# Patient Record
Sex: Male | Born: 1980 | Race: White | Hispanic: No | Marital: Single | Smoking: Never smoker
Health system: Southern US, Community
[De-identification: ages and names within clinical notes are randomized; demographics above are authoritative.]

## PROBLEM LIST (undated history)

## (undated) DIAGNOSIS — F419 Anxiety disorder, unspecified: Secondary | ICD-10-CM

## (undated) DIAGNOSIS — F319 Bipolar disorder, unspecified: Secondary | ICD-10-CM

## (undated) HISTORY — DX: Anxiety disorder, unspecified: F41.9

## (undated) HISTORY — PX: APPENDECTOMY: SHX54

## (undated) HISTORY — DX: Bipolar disorder, unspecified: F31.9

## (undated) HISTORY — PX: GASTRIC BYPASS: SHX52

---

## 2011-10-28 DIAGNOSIS — F329 Major depressive disorder, single episode, unspecified: Secondary | ICD-10-CM | POA: Insufficient documentation

## 2011-10-28 DIAGNOSIS — F32A Depression, unspecified: Secondary | ICD-10-CM | POA: Insufficient documentation

## 2012-09-07 DIAGNOSIS — Z9884 Bariatric surgery status: Secondary | ICD-10-CM | POA: Insufficient documentation

## 2012-09-07 DIAGNOSIS — K909 Intestinal malabsorption, unspecified: Secondary | ICD-10-CM | POA: Insufficient documentation

## 2013-05-05 DIAGNOSIS — M222X9 Patellofemoral disorders, unspecified knee: Secondary | ICD-10-CM | POA: Insufficient documentation

## 2013-09-06 DIAGNOSIS — M25562 Pain in left knee: Secondary | ICD-10-CM

## 2013-09-06 DIAGNOSIS — M25561 Pain in right knee: Secondary | ICD-10-CM | POA: Insufficient documentation

## 2017-04-07 ENCOUNTER — Other Ambulatory Visit: Payer: Self-pay

## 2017-04-07 ENCOUNTER — Encounter: Payer: Self-pay | Admitting: Psychiatry

## 2017-04-07 ENCOUNTER — Ambulatory Visit (INDEPENDENT_AMBULATORY_CARE_PROVIDER_SITE_OTHER): Payer: Federal, State, Local not specified - PPO | Admitting: Psychiatry

## 2017-04-07 VITALS — BP 130/86 | HR 67

## 2017-04-07 DIAGNOSIS — Z8659 Personal history of other mental and behavioral disorders: Secondary | ICD-10-CM

## 2017-04-07 DIAGNOSIS — F411 Generalized anxiety disorder: Secondary | ICD-10-CM

## 2017-04-07 DIAGNOSIS — F330 Major depressive disorder, recurrent, mild: Secondary | ICD-10-CM | POA: Diagnosis not present

## 2017-04-07 MED ORDER — DULOXETINE HCL 30 MG PO CPEP: 30.0000 mg | ORAL_CAPSULE | Freq: Every day | ORAL | 1 refills | Status: DC

## 2017-04-07 MED ORDER — BUPROPION HCL ER (XL) 150 MG PO TB24: 150.0000 mg | ORAL_TABLET | Freq: Every day | ORAL | 1 refills | Status: DC

## 2017-04-07 MED ORDER — HYDROXYZINE PAMOATE 25 MG PO CAPS: 25.0000 mg | ORAL_CAPSULE | Freq: Every day | ORAL | 1 refills | Status: DC | PRN
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
## 2017-05-05 ENCOUNTER — Other Ambulatory Visit: Payer: Self-pay

## 2017-05-05 ENCOUNTER — Ambulatory Visit (INDEPENDENT_AMBULATORY_CARE_PROVIDER_SITE_OTHER): Payer: Federal, State, Local not specified - PPO | Admitting: Psychiatry

## 2017-05-05 ENCOUNTER — Encounter: Payer: Self-pay | Admitting: Psychiatry

## 2017-05-05 ENCOUNTER — Ambulatory Visit: Payer: Federal, State, Local not specified - PPO | Admitting: Psychiatry

## 2017-05-05 VITALS — BP 141/93 | HR 69 | Temp 99.1°F

## 2017-05-05 DIAGNOSIS — F330 Major depressive disorder, recurrent, mild: Secondary | ICD-10-CM

## 2017-05-05 DIAGNOSIS — Z8659 Personal history of other mental and behavioral disorders: Secondary | ICD-10-CM | POA: Diagnosis not present

## 2017-05-05 DIAGNOSIS — F411 Generalized anxiety disorder: Secondary | ICD-10-CM

## 2017-05-05 MED ORDER — DULOXETINE HCL 30 MG PO CPEP: 30.0000 mg | ORAL_CAPSULE | Freq: Every day | ORAL | 2 refills | Status: DC

## 2017-05-05 MED ORDER — BUPROPION HCL ER (XL) 150 MG PO TB24: 150.0000 mg | ORAL_TABLET | Freq: Every day | ORAL | 2 refills | Status: DC
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
## 2017-05-06 ENCOUNTER — Encounter: Payer: Self-pay | Admitting: Psychiatry

## 2017-05-28 ENCOUNTER — Encounter: Payer: Self-pay | Admitting: Emergency Medicine

## 2017-05-28 ENCOUNTER — Emergency Department: Payer: Federal, State, Local not specified - PPO

## 2017-05-28 ENCOUNTER — Emergency Department
Admission: EM | Admit: 2017-05-28 | Discharge: 2017-05-28 | Disposition: A | Discharge status: Home / Self Care | Payer: Federal, State, Local not specified - PPO | Attending: Student in an Organized Health Care Education/Training Program | Admitting: Student in an Organized Health Care Education/Training Program

## 2017-05-28 DIAGNOSIS — R109 Unspecified abdominal pain: Secondary | ICD-10-CM | POA: Diagnosis present

## 2017-05-28 DIAGNOSIS — Z79899 Other long term (current) drug therapy: Secondary | ICD-10-CM | POA: Diagnosis not present

## 2017-05-28 LAB — CBC WITH DIFFERENTIAL/PLATELET
Basophils Absolute: 0.1 10*3/uL (ref 0–0.1)
Basophils Relative: 1 %
EOS ABS: 0.2 10*3/uL (ref 0–0.7)
EOS PCT: 3 %
HCT: 49.3 % (ref 40.0–52.0)
Hemoglobin: 16.8 g/dL (ref 13.0–18.0)
LYMPHS ABS: 2 10*3/uL (ref 1.0–3.6)
Lymphocytes Relative: 29 %
MCH: 30.3 pg (ref 26.0–34.0)
MCHC: 34 g/dL (ref 32.0–36.0)
MCV: 88.9 fL (ref 80.0–100.0)
Monocytes Absolute: 0.7 10*3/uL (ref 0.2–1.0)
Monocytes Relative: 11 %
Neutro Abs: 3.8 10*3/uL (ref 1.4–6.5)
Neutrophils Relative %: 56 %
Platelets: 251 10*3/uL (ref 150–440)
RBC: 5.54 MIL/uL (ref 4.40–5.90)
RDW: 13.2 % (ref 11.5–14.5)
WBC: 6.7 10*3/uL (ref 3.8–10.6)

## 2017-05-28 LAB — COMPREHENSIVE METABOLIC PANEL
ALT: 35 U/L (ref 17–63)
ANION GAP: 10 (ref 5–15)
AST: 28 U/L (ref 15–41)
Albumin: 4.4 g/dL (ref 3.5–5.0)
Alkaline Phosphatase: 68 U/L (ref 38–126)
BUN: 14 mg/dL (ref 6–20)
CHLORIDE: 105 mmol/L (ref 101–111)
CO2: 21 mmol/L — AB (ref 22–32)
CREATININE: 0.58 mg/dL — AB (ref 0.61–1.24)
Calcium: 8.6 mg/dL — ABNORMAL LOW (ref 8.9–10.3)
GFR calc non Af Amer: >60 mL/min (ref >60)
Glucose, Bld: 92 mg/dL (ref 65–99)
Potassium: 4 mmol/L (ref 3.5–5.1)
Sodium: 136 mmol/L (ref 135–145)
Total Bilirubin: 0.8 mg/dL (ref 0.3–1.2)
Total Protein: 7.4 g/dL (ref 6.5–8.1)

## 2017-05-28 LAB — URINALYSIS, COMPLETE (UACMP) WITH MICROSCOPIC
BACTERIA UA: NONE SEEN
BILIRUBIN URINE: NEGATIVE
Glucose, UA: NEGATIVE mg/dL
Hgb urine dipstick: NEGATIVE
KETONES UR: NEGATIVE mg/dL
Leukocytes, UA: NEGATIVE
Nitrite: NEGATIVE
Protein, ur: NEGATIVE mg/dL
RBC / HPF: NONE SEEN RBC/hpf (ref 0–5)
Specific Gravity, Urine: 1.011 (ref 1.005–1.030)
Squamous Epithelial / LPF: NONE SEEN
pH: 7 (ref 5.0–8.0)

## 2017-05-28 MED ORDER — SODIUM CHLORIDE 0.9 % IV BOLUS (SEPSIS)
1000.0000 mL | Freq: Once | INTRAVENOUS | Status: AC
  Administered 2017-05-28: 1000 mL via INTRAVENOUS

## 2017-05-28 MED ORDER — CYCLOBENZAPRINE HCL 10 MG PO TABS: 10.0000 mg | ORAL_TABLET | Freq: Three times a day (TID) | ORAL | 0 refills | Status: AC | PRN

## 2017-05-28 NOTE — ED Triage Notes (Signed)
Pt with right flank pain yesterday, thinks might be a kidney stone.

## 2017-05-28 NOTE — Discharge Instructions (Signed)
Please follow up with the primary care provider of your choice for symptoms that are not improving over the next few days. Return to the ER for symptoms that change or worsen if you are unable to schedule an appointment.

## 2017-05-28 NOTE — ED Provider Notes (Signed)
[facility] Emergency Department Provider Note  ____________________________________________   First MD Initiated Contact with Patient 05/28/17 1258     (approximate)  I have reviewed the triage vital signs and the nursing notes.   HISTORY  Chief Complaint Flank Pain   HPI Jason Sparks is a 37 y.o. male who presents to the emergency department for treatment and evaluation of right flank pain that started yesterday.  He feels that symptoms are consistent with a kidney stone, although he denies hematuria.  He does not have any previous history of kidney stone.  He states that he attempted to take Tylenol and lay on a heating pad last night without relief.  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  Pain is much worse with movement.   Past Medical History:  Diagnosis Date  . Anxiety   . Bipolar disorder [facility])     Patient Active Problem List   Diagnosis Date Noted  . Bilateral knee pain 09/06/2013  . Patellofemoral pain syndrome 05/05/2013  . Status post bariatric surgery 09/07/2012  . Intestinal malabsorption 09/07/2012  . Morbid obesity (HCC) 10/28/2011  . Depression 10/28/2011    Past Surgical History:  Procedure Laterality Date  . APPENDECTOMY    . GASTRIC BYPASS      Prior to Admission medications   Medication Sig Start Date End Date Taking? Authorizing Provider  Biotin 10 MG TABS Take by mouth.    [provider]  buPROPion (WELLBUTRIN XL) 150 MG 24 hr tablet Take 1 tablet (150 mg total) by mouth daily. 05/05/17   Jomarie Longs, MD  Calcium-Cholecalciferol (CALCET PETITES) 200-250 MG-UNIT TABS Take by mouth.    [provider]  Cholecalciferol (VITAMIN D3) 5000 units TABS Take by mouth.    [provider]  Cyanocobalamin (VITAMIN B-12) 5000 MCG SUBL Place under the tongue.    [provider]    cyclobenzaprine (FLEXERIL) 10 MG tablet Take 1 tablet (10 mg total) by mouth 3 (three) times daily as needed for muscle spasms. 05/28/17   Kamree Wiens B, FNP  diphenhydramine-acetaminophen (TYLENOL PM) 25-500 MG TABS tablet Take by mouth.    [provider]  docusate sodium (COLACE) 250 MG capsule Take by mouth.    [provider]  DULoxetine (CYMBALTA) 30 MG capsule Take 1 capsule (30 mg total) by mouth daily. 05/05/17   Jomarie Longs, MD  FERROUS FUMARATE PO Take by mouth.    [provider]  glucosamine-chondroitin 500-400 MG tablet Take by mouth.    [provider]  hydrOXYzine (VISTARIL) 25 MG capsule Take 1 capsule (25 mg total) by mouth daily as needed for anxiety. 04/07/17   Jomarie Longs, MD  Multiple Vitamin (MULTI-VITAMINS) TABS Take by mouth.    [provider]  polyethylene glycol (MIRALAX / GLYCOLAX) packet Take 17 g by mouth.    [provider]  sodium fluoride (SF 5000 PLUS) 1.1 % CREA dental cream  01/30/13   [provider]    Allergies Patient has no known allergies.  Family History  Problem Relation Age of Onset  . Heart disease Mother   . Obesity Father   . Diabetes Maternal Grandmother   . Dementia Paternal Grandfather     Social History Social History   Tobacco Use  . Smoking status: Never Smoker  . Smokeless tobacco: Never Used  Substance Use Topics  .  Alcohol use: No    Frequency: Never  . Drug use: No    Review of Systems  Constitutional: No fever/chills Eyes: No visual changes. ENT: No sore throat. Cardiovascular: Denies chest pain. Respiratory: Denies shortness of breath. Gastrointestinal: No abdominal pain.  No nausea, no vomiting.  No diarrhea.  No constipation. Genitourinary: Negative for dysuria. Musculoskeletal: Positive for back pain. Skin: Negative for rash. Neurological: Negative for headaches, focal weakness or  numbness. ____________________________________________   PHYSICAL EXAM:  VITAL SIGNS: ED Triage Vitals [05/28/17 1128]  Enc Vitals Group     BP 130/84     Pulse Rate 79     Resp 20     Temp 98.9 F (37.2 C)     Temp Source Oral     SpO2 95 %     Weight 245 lb (111.1 kg)     Height      Head Circumference      Peak Flow      Pain Score 5     Pain Loc      Pain Edu?      Excl. in GC?     Constitutional: Alert and oriented. Well appearing and in no acute distress. Eyes: Conjunctivae are normal. PERRL. EOMI. Head: Atraumatic. Nose: No congestion/rhinnorhea. Mouth/Throat: Mucous membranes are moist.  Oropharynx non-erythematous. Neck: No stridor.   Cardiovascular: Normal rate, regular rhythm. Grossly normal heart sounds.  Good peripheral circulation. Respiratory: Normal respiratory effort.  No retractions. Lungs CTAB. Gastrointestinal: Soft and nontender. No distention. No abdominal bruits.  CVA tenderness on the right. Musculoskeletal: No lower extremity tenderness nor edema.  No joint effusions. Neurologic:  Normal speech and language. No gross focal neurologic deficits are appreciated. No gait instability. Skin:  Skin is warm, dry and intact. No rash noted. Psychiatric: Mood and affect are normal. Speech and behavior are normal.  ____________________________________________   LABS (all labs ordered are listed, but only abnormal results are displayed)  Labs Reviewed  URINALYSIS, COMPLETE (UACMP) WITH MICROSCOPIC - Abnormal; Notable for the following components:      Result Value   Color, Urine YELLOW (*)    APPearance CLEAR (*)    All other components within normal limits  COMPREHENSIVE METABOLIC PANEL - Abnormal; Notable for the following components:   CO2 21 (*)    Creatinine, Ser 0.58 (*)    Calcium 8.6 (*)    All other components within normal limits  CBC WITH DIFFERENTIAL/PLATELET   ____________________________________________  EKG  Not  indicated. ____________________________________________  RADIOLOGY  Official radiology report(s): Ct Renal Stone Study  Result Date: 05/28/2017 CLINICAL DATA:  Severe right flank pain beginning last night. EXAM: CT ABDOMEN AND PELVIS WITHOUT CONTRAST TECHNIQUE: Multidetector CT imaging of the abdomen and pelvis was performed following the standard protocol without IV contrast. COMPARISON:  None. FINDINGS: Lower chest: Hepatobiliary: Liver parenchyma is normal. Previous cholecystectomy. Pancreas: Normal Spleen: Normal Adrenals/Urinary Tract: Adrenal glands are normal. Kidneys are normal. No cyst, mass, stone or hydronephrosis. No evidence of passing stone. No stone in the bladder. Stomach/Bowel: Previous gastric surgery. No acute bowel pathology is seen. No evidence of obstruction. No evidence of diverticulitis. I do not see the appendix, either normal or abnormal. Vascular/Lymphatic: Minimal aortic atherosclerosis. IVC is normal. No retroperitoneal adenopathy. Reproductive: Normal Other: No free fluid or air. Musculoskeletal: Ordinary spinal degenerative changes. IMPRESSION: No cause of the acute presentation is identified. No evidence of urinary tract stone disease or other cause right-sided flank pain. Previous cholecystectomy.  Previous gastric surgery.  Electronically Signed   By: Paulina FusiMark  Shogry M.D.   On: 05/28/2017 14:28    ____________________________________________   PROCEDURES  Procedure(s) performed:   Procedures  Critical Care performed: No  ____________________________________________   INITIAL IMPRESSION / ASSESSMENT AND PLAN / ED COURSE   [age] year old male presenting to the emergency department for treatment and evaluation of right flank pain.  CT to rule out renal stone was completed and is negative.  Urinalysis is also reassuring.  There is no hematuria or infection.  Symptoms are most likely musculoskeletal and he will be treated with Flexeril.  He was also encouraged to  take Tylenol.  He was advised to avoid ibuprofen and NSAIDs since he has had a gastric bypass.  He was encouraged to establish primary care and schedule follow-up appointment if he is not improving over the next few days.  He requested to not be put out of work and therefore a note was not given.  He was advised that he should not work if he is taking the Flexeril and that he should reserve that for nighttime only unless he is off. ___________________________________________   FINAL CLINICAL IMPRESSION(S) / ED DIAGNOSES  Final diagnoses:  Right flank pain     ED Discharge Orders        Ordered    cyclobenzaprine (FLEXERIL) 10 MG tablet  3 times daily PRN     05/28/17 1448       Note:  This document was prepared using Dragon voice recognition software and may include unintentional dictation errors.    Chinita Pesterriplett, Damiya Sandefur B, FNP 05/28/17 1620    Willy Eddyobinson, Patrick, MD 05/29/17 [phone]

## 2017-05-28 NOTE — ED Notes (Signed)
See triage note   Presents with right flank pain which started last pm  Pain is non radiating   States he tried heating pad w/o relief   Ambulates well to treatment room  No fever

## 2017-07-01 ENCOUNTER — Ambulatory Visit: Payer: Federal, State, Local not specified - PPO | Admitting: Psychiatry

## 2017-08-17 ENCOUNTER — Other Ambulatory Visit: Payer: Self-pay | Admitting: Psychiatry

## 2017-08-17 DIAGNOSIS — F411 Generalized anxiety disorder: Secondary | ICD-10-CM

## 2017-09-30 ENCOUNTER — Ambulatory Visit: Payer: Federal, State, Local not specified - PPO | Admitting: Psychiatry

## 2017-09-30 ENCOUNTER — Encounter: Payer: Self-pay | Admitting: Psychiatry

## 2017-09-30 ENCOUNTER — Other Ambulatory Visit: Payer: Self-pay

## 2017-09-30 VITALS — BP 132/93 | HR 73 | Temp 98.6°F | Ht 70.0 in | Wt 264.0 lb

## 2017-09-30 DIAGNOSIS — F509 Eating disorder, unspecified: Secondary | ICD-10-CM | POA: Diagnosis not present

## 2017-09-30 DIAGNOSIS — Z8659 Personal history of other mental and behavioral disorders: Secondary | ICD-10-CM

## 2017-09-30 DIAGNOSIS — F330 Major depressive disorder, recurrent, mild: Secondary | ICD-10-CM | POA: Diagnosis not present

## 2017-09-30 DIAGNOSIS — F411 Generalized anxiety disorder: Secondary | ICD-10-CM

## 2017-09-30 MED ORDER — HYDROXYZINE PAMOATE 25 MG PO CAPS: 25.0000 mg | ORAL_CAPSULE | Freq: Every day | ORAL | 2 refills | Status: AC | PRN

## 2017-09-30 MED ORDER — DULOXETINE HCL 30 MG PO CPEP: 30.0000 mg | ORAL_CAPSULE | Freq: Two times a day (BID) | ORAL | 2 refills | Status: DC

## 2017-09-30 MED ORDER — BUPROPION HCL ER (XL) 150 MG PO TB24: 150.0000 mg | ORAL_TABLET | Freq: Every day | ORAL | 2 refills | Status: AC

## 2017-09-30 NOTE — Patient Instructions (Signed)
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
## 2017-10-05 ENCOUNTER — Other Ambulatory Visit: Payer: Self-pay | Admitting: Psychiatry

## 2017-10-05 DIAGNOSIS — F411 Generalized anxiety disorder: Secondary | ICD-10-CM

## 2017-11-19 ENCOUNTER — Ambulatory Visit: Payer: Federal, State, Local not specified - PPO | Admitting: Psychiatry

## 2018-02-17 ENCOUNTER — Other Ambulatory Visit: Payer: Self-pay | Admitting: Psychiatry

## 2018-02-17 DIAGNOSIS — F411 Generalized anxiety disorder: Secondary | ICD-10-CM

## 2018-02-17 MED ORDER — DULOXETINE HCL 30 MG PO CPEP: 30.0000 mg | ORAL_CAPSULE | Freq: Two times a day (BID) | ORAL | 0 refills | Status: AC

## 2018-02-17 NOTE — Telephone Encounter (Signed)
Sent Cymbalta for 30 days , pt needs an appointment prior to next refill

## 2018-03-04 ENCOUNTER — Other Ambulatory Visit: Payer: Self-pay | Admitting: Psychiatry

## 2018-03-04 DIAGNOSIS — F411 Generalized anxiety disorder: Secondary | ICD-10-CM

## 2018-03-04 NOTE — Telephone Encounter (Signed)
Pt needs appointment prior to further refills.

## 2018-06-10 ENCOUNTER — Other Ambulatory Visit: Payer: Self-pay | Admitting: Psychiatry

## 2018-06-10 DIAGNOSIS — F411 Generalized anxiety disorder: Secondary | ICD-10-CM

## 2018-06-12 ENCOUNTER — Other Ambulatory Visit: Payer: Self-pay | Admitting: Psychiatry

## 2018-06-12 DIAGNOSIS — F411 Generalized anxiety disorder: Secondary | ICD-10-CM

## 2018-06-15 NOTE — Telephone Encounter (Signed)
pt called left message that he needed a refill on wellbutrinxl

## 2018-06-15 NOTE — Telephone Encounter (Signed)
Ok thanks 

## 2018-06-15 NOTE — Telephone Encounter (Signed)
pt was called and left a message that he had not been seen since june of last year and that he would be considered a new pt and that he would need to renew a referral to set up appt to be seen

## 2018-08-07 IMAGING — CT CT RENAL STONE PROTOCOL
2 of 4 series · 17 of 46 positions shown, 19 images · non-contrast
Comparison: None.

CLINICAL DATA: Severe right flank pain beginning last night.

EXAM:
CT ABDOMEN AND PELVIS WITHOUT CONTRAST
TECHNIQUE: Multidetector CT imaging of the abdomen and pelvis was performed
following the standard protocol without IV contrast.

[Series 2: stone full standard · axial · 0.84mm/px · z∈[-1039,-564]mm · 14 of 105 slices shown, 16 images]
[im 5/105  soft-tissue]
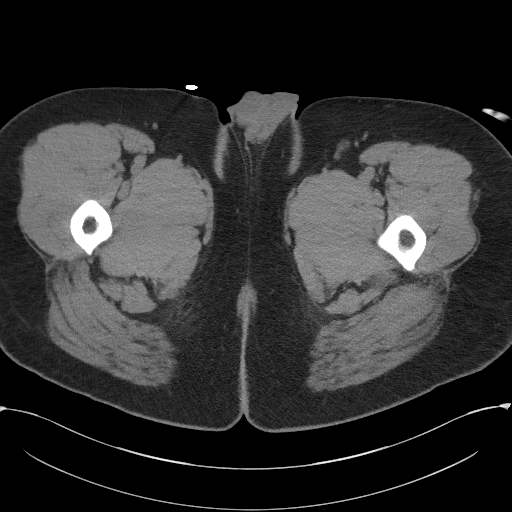
[im 5/105  bone]
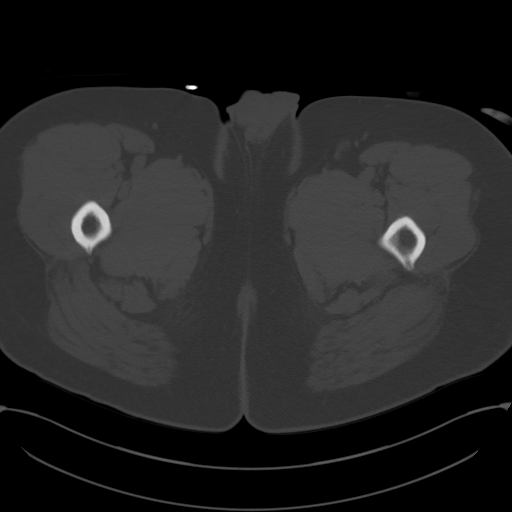
[im 13/105  soft-tissue]
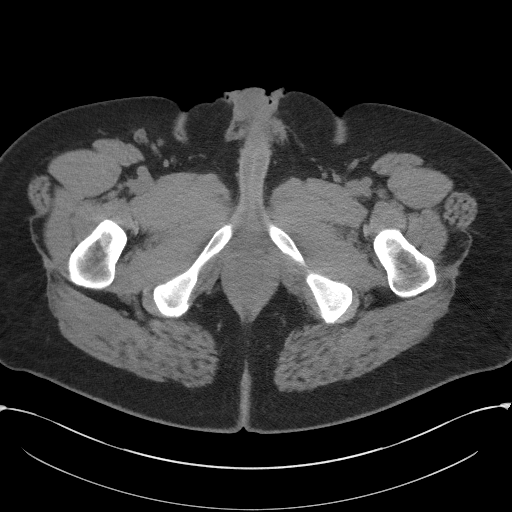
[im 21/105  soft-tissue]
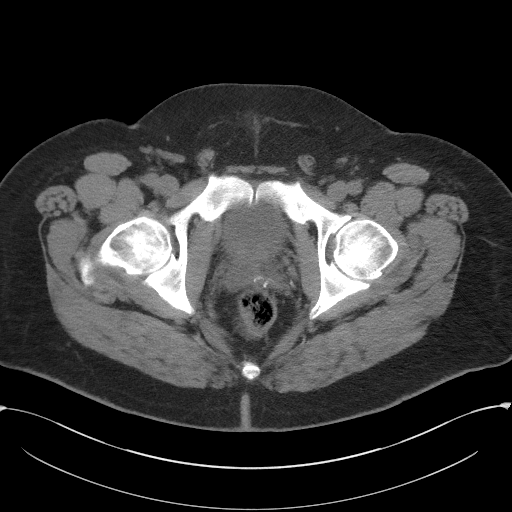
[im 30/105  soft-tissue]
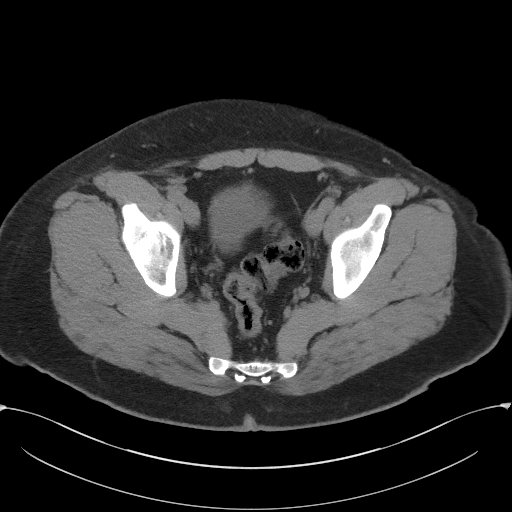
[im 34/105  soft-tissue]
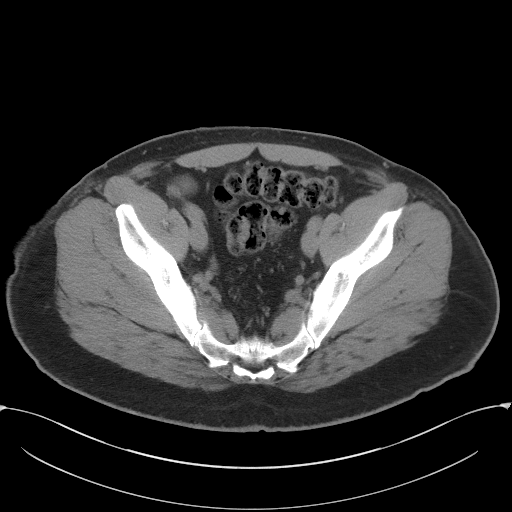
[im 42/105  soft-tissue]
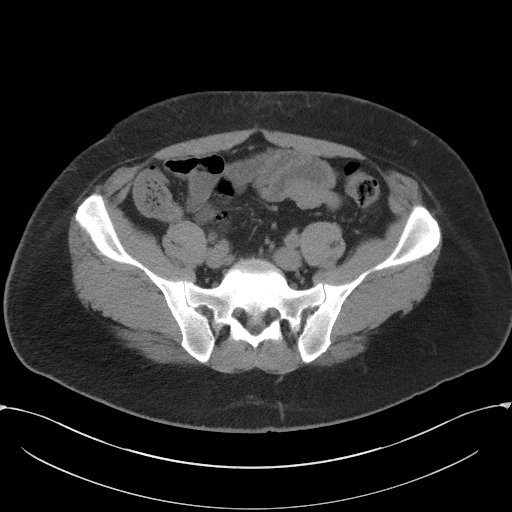
[im 50/105  soft-tissue]
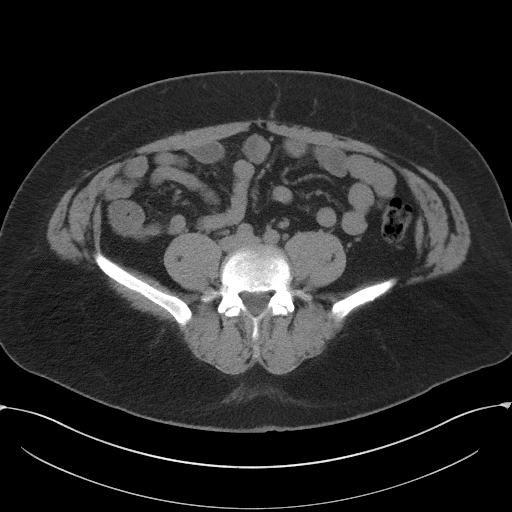
[im 55/105  soft-tissue]
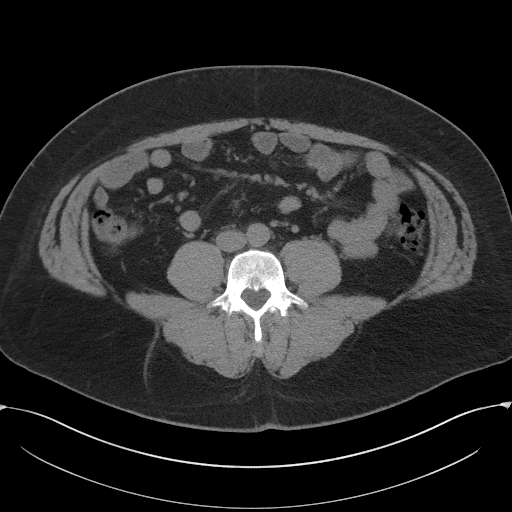
[im 63/105  soft-tissue]
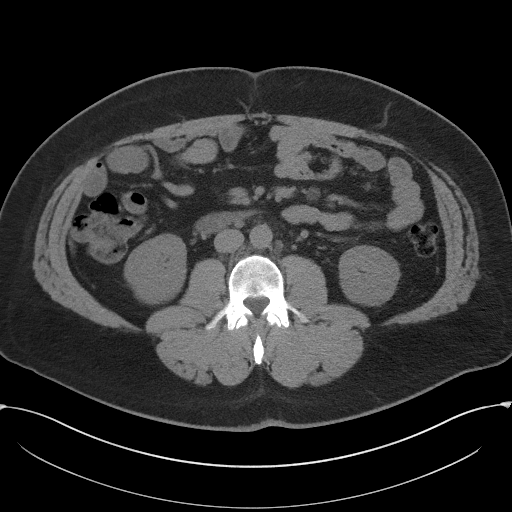
[im 63/105  bone]
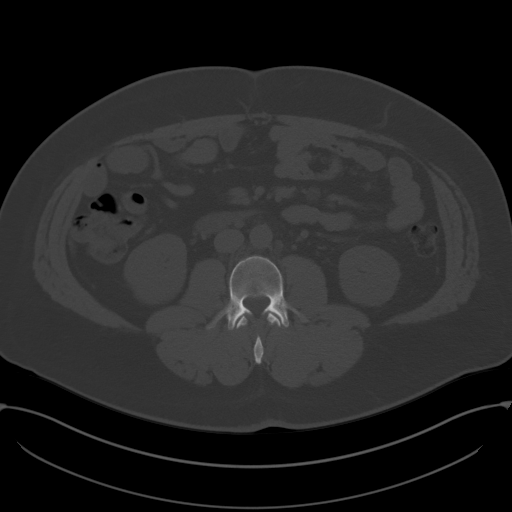
[im 71/105  soft-tissue]
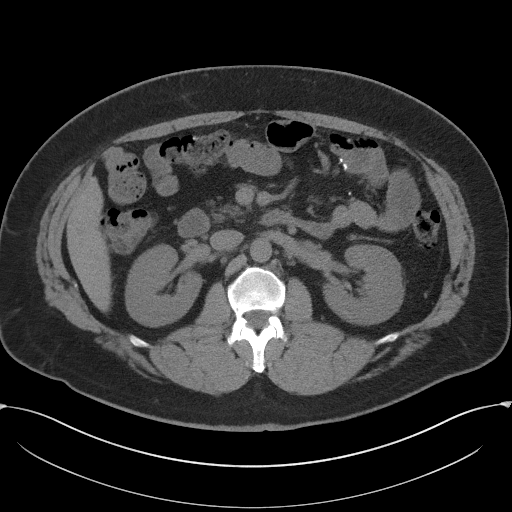
[im 80/105  soft-tissue]
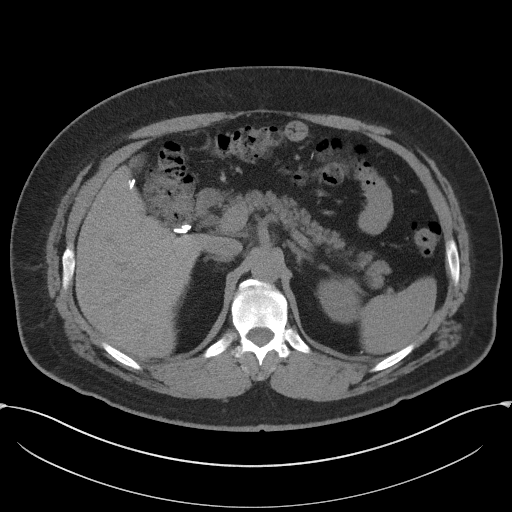
[im 84/105  soft-tissue]
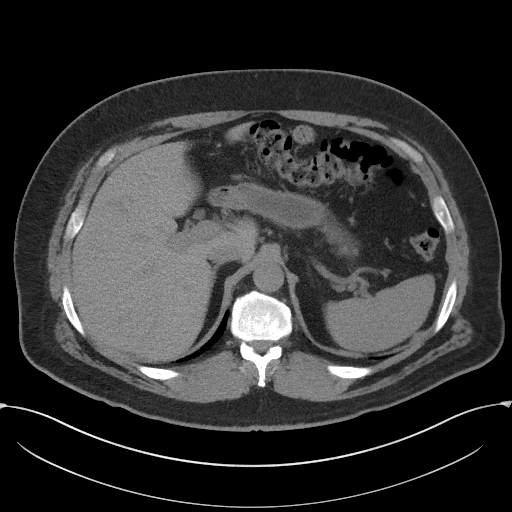
[im 92/105  soft-tissue]
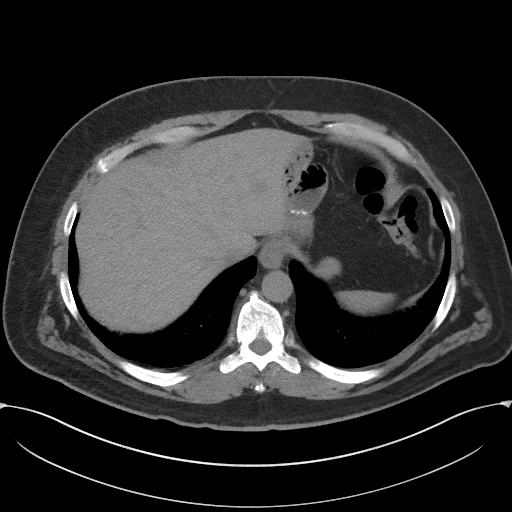
[im 100/105  soft-tissue]
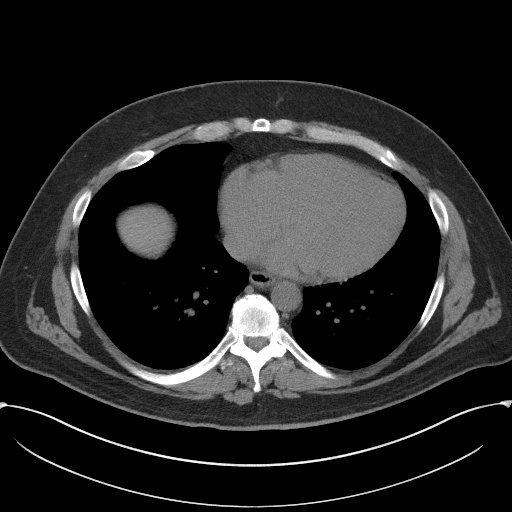

[Series 5: coronal · coronal · 0.85mm/px · 3 of 150 slices shown]
[im 50/150  soft-tissue]
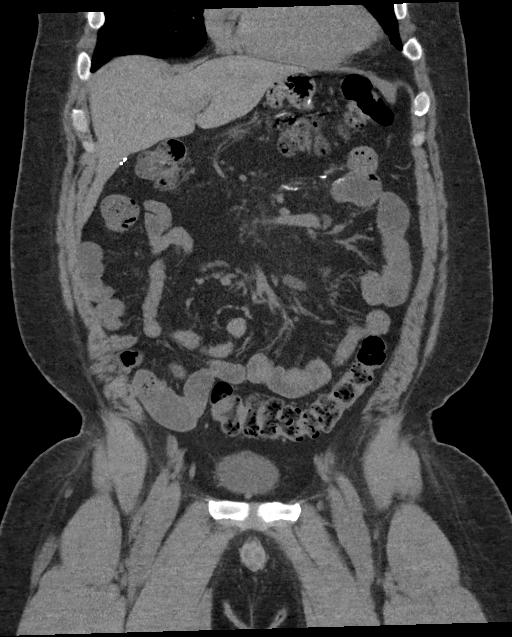
[im 67/150  soft-tissue]
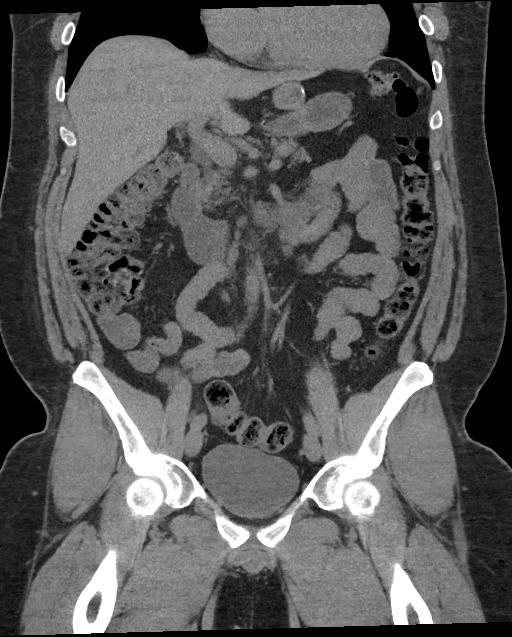
[im 83/150  soft-tissue]
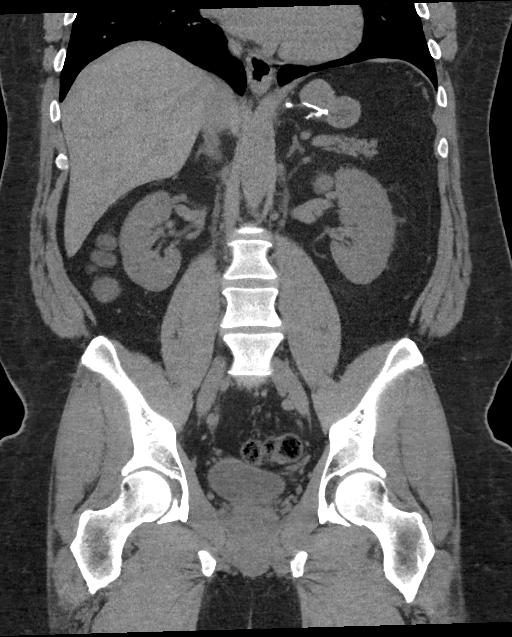

[17 of 46 positions shown; findings below may reference images not displayed]

FINDINGS: Lower chest:

Hepatobiliary: Liver parenchyma is normal. Previous cholecystectomy.

Pancreas: Normal

Spleen: Normal

Adrenals/Urinary Tract: Adrenal glands are normal. Kidneys are
normal. No cyst, mass, stone or hydronephrosis. No evidence of
passing stone. No stone in the bladder.

Stomach/Bowel: Previous gastric surgery. No acute bowel pathology is
seen. No evidence of obstruction. No evidence of diverticulitis. I
do not see the appendix, either normal or abnormal.

Vascular/Lymphatic: Minimal aortic atherosclerosis. IVC is normal.
No retroperitoneal adenopathy.

Reproductive: Normal

Other: No free fluid or air.

Musculoskeletal: Ordinary spinal degenerative changes.
IMPRESSION: No cause of the acute presentation is identified. No evidence of
urinary tract stone disease or other cause right-sided flank pain.

Previous cholecystectomy.  Previous gastric surgery.
